# Patient Record
Sex: Female | Born: 1955 | Race: Black or African American | Hispanic: No | Marital: Single
Health system: Southern US, Community
[De-identification: ages and names within clinical notes are randomized; demographics above are authoritative.]

## PROBLEM LIST (undated history)

## (undated) DIAGNOSIS — I10 Essential (primary) hypertension: Secondary | ICD-10-CM

---

## 1997-10-16 ENCOUNTER — Encounter: Admission: RE | Admit: 1997-10-16 | Discharge: 1997-10-16 | Payer: Self-pay | Admitting: Hematology and Oncology

## 1998-06-17 ENCOUNTER — Encounter: Admission: RE | Admit: 1998-06-17 | Discharge: 1998-06-17 | Payer: Self-pay | Admitting: Internal Medicine

## 1998-06-17 ENCOUNTER — Other Ambulatory Visit: Admission: RE | Admit: 1998-06-17 | Discharge: 1998-06-17 | Payer: Self-pay | Admitting: *Deleted

## 1998-09-24 ENCOUNTER — Encounter: Admission: RE | Admit: 1998-09-24 | Discharge: 1998-09-24 | Payer: Self-pay | Admitting: Internal Medicine

## 1998-09-30 ENCOUNTER — Encounter: Admission: RE | Admit: 1998-09-30 | Discharge: 1998-09-30 | Payer: Self-pay | Admitting: Internal Medicine

## 2005-03-16 ENCOUNTER — Emergency Department (HOSPITAL_COMMUNITY): Admission: EM | Admit: 2005-03-16 | Discharge: 2005-03-16 | Payer: Self-pay | Admitting: Emergency Medicine

## 2005-10-25 ENCOUNTER — Emergency Department (HOSPITAL_COMMUNITY): Admission: EM | Admit: 2005-10-25 | Discharge: 2005-10-25 | Payer: Self-pay | Admitting: Emergency Medicine

## 2006-05-15 ENCOUNTER — Emergency Department (HOSPITAL_COMMUNITY): Admission: EM | Admit: 2006-05-15 | Discharge: 2006-05-15 | Payer: Self-pay | Admitting: Emergency Medicine

## 2006-06-23 ENCOUNTER — Encounter: Admission: RE | Admit: 2006-06-23 | Discharge: 2006-06-23 | Payer: Self-pay | Admitting: Internal Medicine

## 2006-07-08 ENCOUNTER — Encounter: Admission: RE | Admit: 2006-07-08 | Discharge: 2006-07-08 | Payer: Self-pay | Admitting: Internal Medicine

## 2012-07-21 ENCOUNTER — Other Ambulatory Visit: Payer: Self-pay | Admitting: Family

## 2012-07-21 ENCOUNTER — Ambulatory Visit
Admission: RE | Admit: 2012-07-21 | Discharge: 2012-07-21 | Disposition: A | Discharge status: Home / Self Care | Payer: BC Managed Care – PPO | Source: Ambulatory Visit | Attending: Internal Medicine | Admitting: Internal Medicine

## 2012-07-21 ENCOUNTER — Other Ambulatory Visit: Payer: Self-pay | Admitting: Internal Medicine

## 2012-07-21 DIAGNOSIS — R609 Edema, unspecified: Secondary | ICD-10-CM

## 2012-07-21 DIAGNOSIS — R52 Pain, unspecified: Secondary | ICD-10-CM

## 2013-08-23 ENCOUNTER — Other Ambulatory Visit: Payer: Self-pay

## 2013-08-23 DIAGNOSIS — Z1231 Encounter for screening mammogram for malignant neoplasm of breast: Secondary | ICD-10-CM

## 2013-09-04 ENCOUNTER — Ambulatory Visit
Admission: RE | Admit: 2013-09-04 | Discharge: 2013-09-04 | Disposition: A | Discharge status: Home / Self Care | Payer: BC Managed Care – PPO | Source: Ambulatory Visit

## 2013-09-04 ENCOUNTER — Encounter (INDEPENDENT_AMBULATORY_CARE_PROVIDER_SITE_OTHER): Payer: Self-pay

## 2013-09-04 DIAGNOSIS — Z1231 Encounter for screening mammogram for malignant neoplasm of breast: Secondary | ICD-10-CM

## 2013-09-18 ENCOUNTER — Other Ambulatory Visit: Payer: Self-pay | Admitting: Internal Medicine

## 2013-09-18 DIAGNOSIS — R3129 Other microscopic hematuria: Secondary | ICD-10-CM

## 2013-09-19 ENCOUNTER — Other Ambulatory Visit: Payer: BC Managed Care – PPO

## 2013-09-20 ENCOUNTER — Ambulatory Visit
Admission: RE | Admit: 2013-09-20 | Discharge: 2013-09-20 | Disposition: A | Discharge status: Home / Self Care | Payer: BC Managed Care – PPO | Source: Ambulatory Visit | Attending: Internal Medicine | Admitting: Internal Medicine

## 2013-09-20 DIAGNOSIS — R3129 Other microscopic hematuria: Secondary | ICD-10-CM

## 2014-09-13 ENCOUNTER — Other Ambulatory Visit: Payer: Self-pay | Admitting: Family

## 2014-09-13 DIAGNOSIS — M79661 Pain in right lower leg: Secondary | ICD-10-CM

## 2014-09-14 ENCOUNTER — Ambulatory Visit
Admission: RE | Admit: 2014-09-14 | Discharge: 2014-09-14 | Disposition: A | Discharge status: Home / Self Care | Payer: BC Managed Care – PPO | Source: Ambulatory Visit | Attending: Family | Admitting: Family

## 2014-09-14 DIAGNOSIS — M79661 Pain in right lower leg: Secondary | ICD-10-CM

## 2014-09-19 ENCOUNTER — Ambulatory Visit
Admission: RE | Admit: 2014-09-19 | Discharge: 2014-09-19 | Disposition: A | Discharge status: Home / Self Care | Payer: BC Managed Care – PPO | Source: Ambulatory Visit | Attending: Family | Admitting: Family

## 2014-09-19 DIAGNOSIS — M79661 Pain in right lower leg: Secondary | ICD-10-CM

## 2016-03-29 ENCOUNTER — Emergency Department (HOSPITAL_COMMUNITY): Payer: BC Managed Care – PPO

## 2016-03-29 ENCOUNTER — Other Ambulatory Visit: Payer: Self-pay

## 2016-03-29 ENCOUNTER — Encounter (HOSPITAL_COMMUNITY): Payer: Self-pay

## 2016-03-29 ENCOUNTER — Emergency Department (HOSPITAL_COMMUNITY)
Admission: EM | Admit: 2016-03-29 | Discharge: 2016-03-29 | Disposition: A | Discharge status: Home / Self Care | Payer: BC Managed Care – PPO | Attending: Emergency Medicine | Admitting: Emergency Medicine

## 2016-03-29 DIAGNOSIS — I10 Essential (primary) hypertension: Secondary | ICD-10-CM | POA: Insufficient documentation

## 2016-03-29 DIAGNOSIS — R0789 Other chest pain: Secondary | ICD-10-CM | POA: Insufficient documentation

## 2016-03-29 DIAGNOSIS — R079 Chest pain, unspecified: Secondary | ICD-10-CM

## 2016-03-29 HISTORY — DX: Essential (primary) hypertension: I10

## 2016-03-29 LAB — CBC WITH DIFFERENTIAL/PLATELET
Basophils Absolute: 0 10*3/uL (ref 0.0–0.1)
Basophils Relative: 0 %
Eosinophils Absolute: 0.1 10*3/uL (ref 0.0–0.7)
Eosinophils Relative: 1 %
HCT: 37.8 % (ref 36.0–46.0)
Hemoglobin: 12.4 g/dL (ref 12.0–15.0)
Lymphocytes Relative: 28 %
Lymphs Abs: 2.5 10*3/uL (ref 0.7–4.0)
MCH: 29 pg (ref 26.0–34.0)
MCHC: 32.8 g/dL (ref 30.0–36.0)
MCV: 88.5 fL (ref 78.0–100.0)
Monocytes Absolute: 0.6 10*3/uL (ref 0.1–1.0)
Monocytes Relative: 6 %
Neutro Abs: 5.8 10*3/uL (ref 1.7–7.7)
Neutrophils Relative %: 65 %
Platelets: 326 10*3/uL (ref 150–400)
RBC: 4.27 MIL/uL (ref 3.87–5.11)
RDW: 14.7 % (ref 11.5–15.5)
WBC: 8.9 10*3/uL (ref 4.0–10.5)

## 2016-03-29 LAB — BASIC METABOLIC PANEL
Anion gap: 11 (ref 5–15)
BUN: 14 mg/dL (ref 6–20)
CO2: 22 mmol/L (ref 22–32)
Calcium: 9.2 mg/dL (ref 8.9–10.3)
Chloride: 107 mmol/L (ref 101–111)
Creatinine, Ser: 1.01 mg/dL — ABNORMAL HIGH (ref 0.44–1.00)
GFR calc Af Amer: >60 mL/min (ref >60)
GFR calc non Af Amer: 58 mL/min — ABNORMAL LOW (ref >60)
GLUCOSE: 83 mg/dL (ref 65–99)
POTASSIUM: 3.8 mmol/L (ref 3.5–5.1)
Sodium: 140 mmol/L (ref 135–145)

## 2016-03-29 LAB — I-STAT TROPONIN, ED
Troponin i, poc: 0 ng/mL (ref 0.00–0.08)
Troponin i, poc: 0 ng/mL (ref 0.00–0.08)

## 2016-03-29 NOTE — ED Notes (Signed)
Patient d/c'd from monitor, continuous pulse oximetry and blood pressure cuff; patient getting dressed to be discharged home 

## 2016-03-29 NOTE — ED Triage Notes (Signed)
Pt brought in by EMS due having chest pain while at church this morning. Pt rating chest pain 6 out of 10. Pt is hypertensive. Chest pain is not radiating. Pt a&ox4.

## 2016-03-29 NOTE — ED Provider Notes (Signed)
[facility] Provider Note   CSN: [id number] Arrival date & time: 03/29/16  1143     History   Chief Complaint Chief Complaint  Patient presents with  . Chest Pain    HPI Phyllis Shaw is a 60 y.o. female.  HPI   Patient is a 60 year old female with history of hypertension who presents the ED with complaint of chest pain, onset 10 AM. Patient reports while she was seen at church she began to have pressure to the left side of her chest under her breast. She states the pain would last for a few minutes at a time and then resolve spontaneously. Patient reports her last episode of chest pain occurred upon arrival to the ED and resolved within a minute. Patient currently denies having any pain or complaints at this time. Denies fever, chills, diaphoresis, lightheadedness, dizziness, headache, cough, shortness of breath, wheezing, abdominal pain, nausea, vomiting, numbness, tingling, weakness, leg swelling. Patient denies taking any medication for her symptoms but reports EMS administered aspirin en route. Patient denies having similar episodes of chest pain the past. Denies personal or family history of heart disease. Denies smoking cigarettes. Denies any recent hospitalizations, recent surgery, recent immobilizations/long car or airplane travel, hx of CA, hx of DVT/PE.   PCP- Dr. Darcella GasmanStarkes ([facility])  Past Medical History:  Diagnosis Date  . Hypertension     There are no active problems to display for this patient.   History reviewed. No pertinent surgical history.  OB History    No data available       Home Medications    Prior to Admission medications   Not on File    Family History No family history on file.  Social History Social History  Substance Use Topics  . Smoking status: Unknown If Ever Smoked  . Smokeless tobacco: Not on file  . Alcohol use Not on file     Allergies   Patient has no allergy information on  record.   Review of Systems Review of Systems  Cardiovascular: Positive for chest pain.  All other systems reviewed and are negative.    Physical Exam Updated Vital Signs BP 176/80 (BP Location: Right Arm)   Pulse 70   Temp 97.8 F (36.6 C) (Oral)   Resp 20   SpO2 98%   Physical Exam  Constitutional: She is oriented to person, place, and time. She appears well-developed and well-nourished. No distress.  HENT:  Head: Normocephalic and atraumatic.  Mouth/Throat: Uvula is midline, oropharynx is clear and moist and mucous membranes are normal. No oropharyngeal exudate, posterior oropharyngeal edema, posterior oropharyngeal erythema or tonsillar abscesses. No tonsillar exudate.  Eyes: Conjunctivae and EOM are normal. Pupils are equal, round, and reactive to light. Right eye exhibits no discharge. Left eye exhibits no discharge. No scleral icterus.  Neck: Normal range of motion. Neck supple.  Cardiovascular: Normal rate, regular rhythm, normal heart sounds and intact distal pulses.   Pulmonary/Chest: Effort normal and breath sounds normal. No respiratory distress. She has no wheezes. She has no rales. She exhibits no tenderness.  Abdominal: Soft. Bowel sounds are normal. She exhibits no distension and no mass. There is no tenderness. There is no rebound and no guarding. No hernia.  Musculoskeletal: Normal range of motion. She exhibits no edema.  Neurological: She is alert and oriented to person, place, and time.  Skin: Skin is warm and dry. She is not diaphoretic.  Nursing note and vitals reviewed.    ED  Treatments / Results  Labs (all labs ordered are listed, but only abnormal results are displayed) Labs Reviewed  CBC WITH DIFFERENTIAL/PLATELET  BASIC METABOLIC PANEL  I-STAT TROPOININ, ED    EKG  EKG Interpretation  Date/Time:  Sunday March 29 2016 11:40:21 EST Ventricular Rate:  75 PR Interval:  208 QRS Duration: 96 QT Interval:  410 QTC Calculation: 457 R  Axis:   12 Text Interpretation:  Normal sinus rhythm Normal ECG Confirmed by RAY MD, Duwayne HeckANIELLE ([id number]) on 03/29/2016 11:57:28 AM       Radiology No results found.  Procedures Procedures (including critical care time)  Medications Ordered in ED Medications - No data to display   Initial Impression / Assessment and Plan / ED Course  I have reviewed the triage vital signs and the nursing notes.  Pertinent labs & imaging results that were available during my care of the patient were reviewed by me and considered in my medical decision making (see chart for details).  Clinical Course    Patient presents with intermittent episodes of chest pain that occurred while she was standing at church. Hx of HTN. Denies personal or family hx of cardiac disease. VSS. On initial evaluation pt denies any sxs or CP. Exam unremarkable. EKG showed NSR, no acute ischemic changes. Negative trop. CXR negative. Labs unremarkable. HEART score 2. On reevaluation patient is resting comfortably in bed in denies any chest pain or shortness of breath. Will order troponin for complete chest pain evaluation.  Hand-off to Dr. Lake BellsAnn Smith. Pending delta trop. Plan to d/c pt home with outpatient cardiology follow up assuming delta trop negative.  Final Clinical Impressions(s) / ED Diagnoses   Final diagnoses:  None    New Prescriptions New Prescriptions   No medications on file     Phyllis Henleicole Elizabeth Cinde Ebert, PA-C 03/29/16 1556    Margarita Grizzleanielle Ray, MD 04/06/16 1907

## 2016-03-29 NOTE — ED Notes (Signed)
Nicole PA at bedside   

## 2016-03-29 NOTE — ED Notes (Signed)
Patient transported to X-ray 

## 2016-03-29 NOTE — ED Provider Notes (Signed)
Assumed care from Melburn HakeNicole Nadeau, PA-C at 4 PM. Briefly patient is a [age] year female possible history of hypertension who presented with chest pain since 10 AM that started at church. Resolved spontaneously and she is currently chest pain-free. Initial workup including CXR, EKG, labs and troponin unremarkable. Awaiting delta troponin.  Delta troponin is negative 2. She was discharged in stable condition. Strict return precautions were discussed the patient and family and they report understanding agreement with plan. Patient was referred to cardiology for further outpatient workup.  During this shift, care supervised by Dr. Jodi MourningZavitz, ED attending   Isa RankinAnn B Tayquan Gassman, MD 03/29/16 1652    Blane OharaJoshua Zavitz, MD 03/30/16 [phone]

## 2016-06-02 ENCOUNTER — Other Ambulatory Visit: Payer: Self-pay | Admitting: Internal Medicine

## 2016-06-05 ENCOUNTER — Other Ambulatory Visit: Payer: Self-pay | Admitting: Family

## 2016-06-05 DIAGNOSIS — N644 Mastodynia: Secondary | ICD-10-CM

## 2016-06-10 ENCOUNTER — Ambulatory Visit
Admission: RE | Admit: 2016-06-10 | Discharge: 2016-06-10 | Disposition: A | Discharge status: Home / Self Care | Payer: BC Managed Care – PPO | Source: Ambulatory Visit | Attending: Family | Admitting: Family

## 2016-06-10 DIAGNOSIS — N644 Mastodynia: Secondary | ICD-10-CM

## 2019-02-14 ENCOUNTER — Other Ambulatory Visit: Payer: Self-pay

## 2019-02-14 DIAGNOSIS — Z20822 Contact with and (suspected) exposure to covid-19: Secondary | ICD-10-CM

## 2019-02-16 LAB — NOVEL CORONAVIRUS, NAA: SARS-CoV-2, NAA: NOT DETECTED

## 2019-12-06 ENCOUNTER — Other Ambulatory Visit: Payer: Self-pay | Admitting: Gastroenterology

## 2019-12-06 DIAGNOSIS — Z86010 Personal history of colonic polyps: Secondary | ICD-10-CM

## 2019-12-27 ENCOUNTER — Ambulatory Visit
Admission: RE | Admit: 2019-12-27 | Discharge: 2019-12-27 | Disposition: A | Discharge status: Home / Self Care | Payer: Medicare PPO | Source: Ambulatory Visit | Attending: Gastroenterology | Admitting: Gastroenterology

## 2019-12-27 DIAGNOSIS — Z86010 Personal history of colonic polyps: Secondary | ICD-10-CM

## 2020-02-26 ENCOUNTER — Ambulatory Visit (INDEPENDENT_AMBULATORY_CARE_PROVIDER_SITE_OTHER): Payer: Medicare PPO

## 2020-02-26 ENCOUNTER — Ambulatory Visit (HOSPITAL_COMMUNITY)
Admission: EM | Admit: 2020-02-26 | Discharge: 2020-02-26 | Disposition: A | Discharge status: Home / Self Care | Payer: Medicare PPO | Attending: Family Medicine | Admitting: Family Medicine

## 2020-02-26 ENCOUNTER — Other Ambulatory Visit: Payer: Self-pay

## 2020-02-26 ENCOUNTER — Encounter (HOSPITAL_COMMUNITY): Payer: Self-pay | Admitting: *Deleted

## 2020-02-26 DIAGNOSIS — M79601 Pain in right arm: Secondary | ICD-10-CM | POA: Diagnosis not present

## 2020-02-26 DIAGNOSIS — M79621 Pain in right upper arm: Secondary | ICD-10-CM | POA: Diagnosis not present

## 2020-02-26 DIAGNOSIS — W19XXXA Unspecified fall, initial encounter: Secondary | ICD-10-CM

## 2020-02-26 IMAGING — DX DG HUMERUS 2V *R*
2 series · 2 of 2 positions shown · non-contrast
Comparison: None.

CLINICAL DATA: Recent fall with arm pain, initial encounter

EXAM:
RIGHT HUMERUS - 2+ VIEW

[humerus ap]
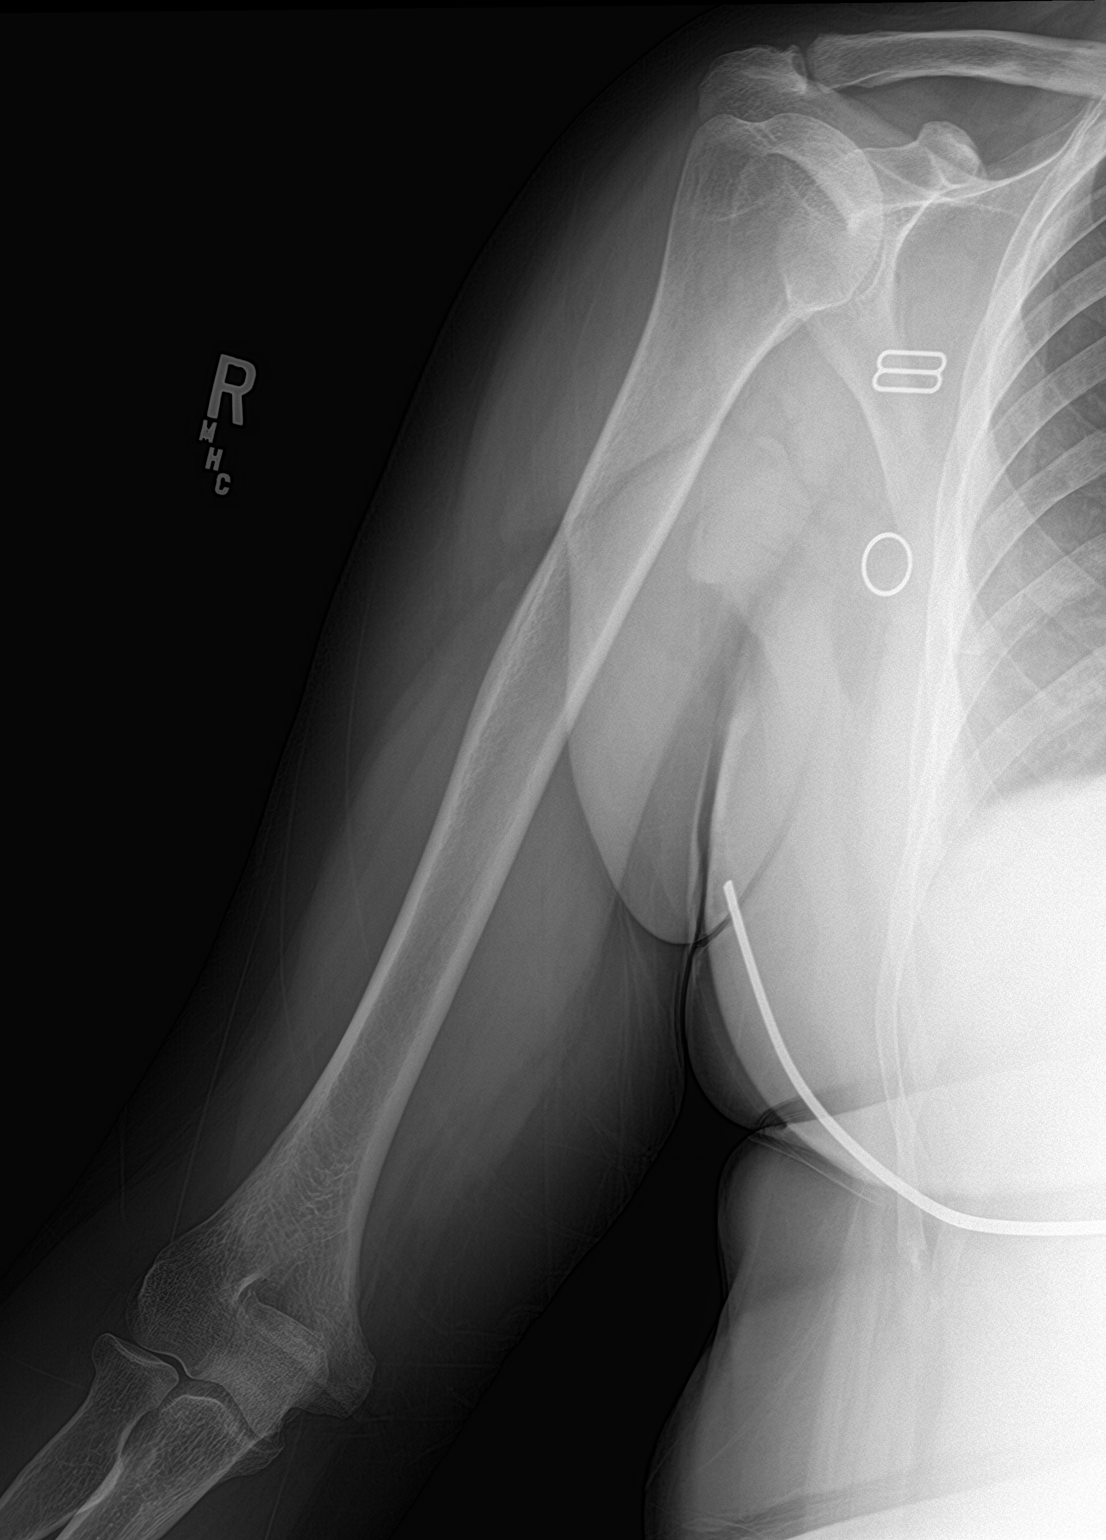

[humerus lat]
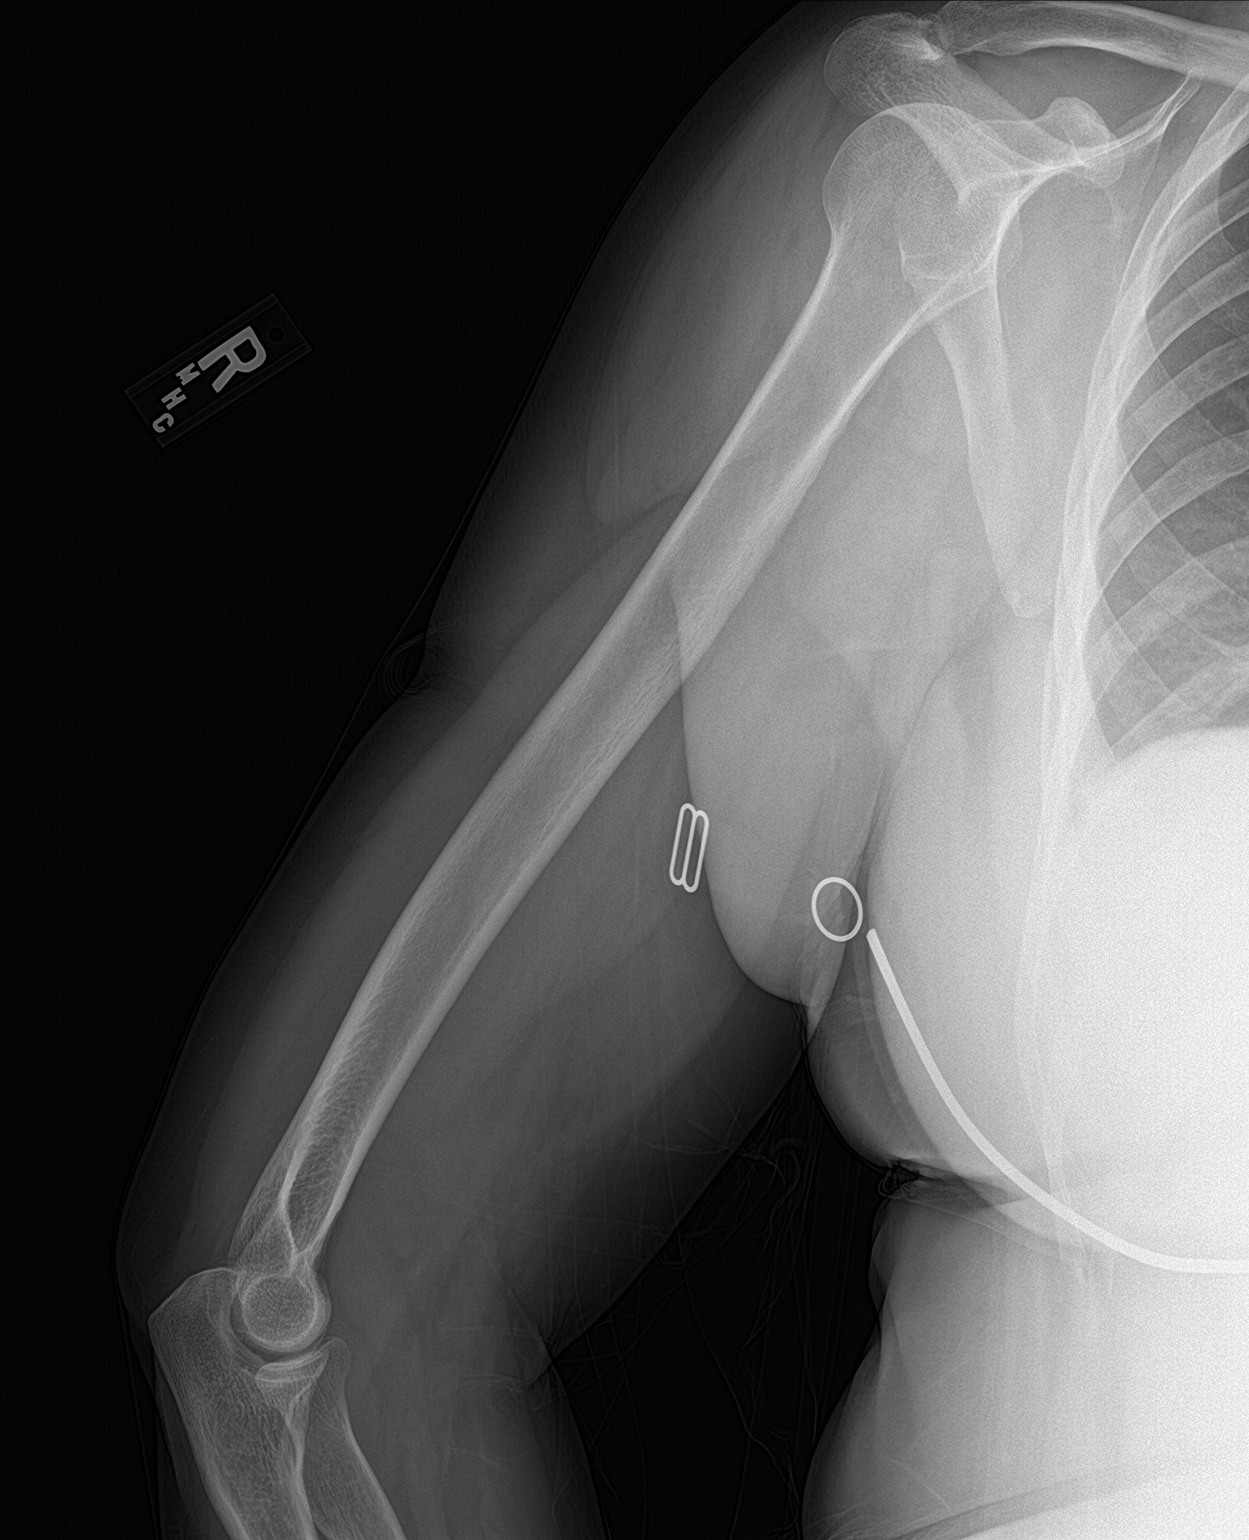

[2 of 2 positions shown; findings below may reference images not displayed]

FINDINGS: Degenerative changes of the acromioclavicular joint are seen. No
acute fracture or dislocation is noted. No soft tissue abnormality
is seen.
IMPRESSION: No acute abnormality noted.

## 2020-02-26 MED ORDER — IBUPROFEN 600 MG PO TABS: 600.0000 mg | ORAL_TABLET | Freq: Four times a day (QID) | ORAL | 0 refills | Status: AC | PRN

## 2020-02-26 NOTE — Discharge Instructions (Signed)
Wear sling for a couple of days to help rest arm Take ibuprofen as needed for Pain put ice on area to reduce pain and swelling  see your doctor if not improving in a few days

## 2020-02-26 NOTE — ED Provider Notes (Signed)
[facility]    CSN: [id number] Arrival date & time: 02/26/20  1712      History   Chief Complaint Chief Complaint  Patient presents with  . Fall  . Arm Injury    Rt    HPI Phyllis Shaw is a 64 y.o. female.   HPI  Patient states she fell today in her home.  She had her right arm over the side of the couch.  She points to just above her elbow and upper arm where the pain is most severe.  She states she can move her elbow and her shoulder.  She states that she has been trying to rest the arm and take over-the-counter medicines.  Still painful. She has well-controlled hypertension She tripped and fell because of balance, certain that she did not have any syncope or dizziness  Past Medical History:  Diagnosis Date  . Hypertension     There are no problems to display for this patient.   History reviewed. No pertinent surgical history.  OB History   No obstetric history on file.      Home Medications    Prior to Admission medications   Medication Sig Start Date End Date Taking? Authorizing Provider  telmisartan-hydrochlorothiazide (MICARDIS HCT) 80-12.5 MG tablet Take 1 tablet by mouth daily. 12/30/15  Yes [provider]  ibuprofen (ADVIL) 600 MG tablet Take 1 tablet (600 mg total) by mouth every 6 (six) hours as needed. 02/26/20   Eustace Moore, MD    Family History History reviewed. No pertinent family history.  Social History Social History   Tobacco Use  . Smoking status: Unknown If Ever Smoked  . Smokeless tobacco: Never Used  Substance Use Topics  . Alcohol use: Not Currently  . Drug use: Not on file     Allergies   Patient has no known allergies.   Review of Systems Review of Systems See HPI  Physical Exam Triage Vital Signs ED Triage Vitals  Enc Vitals Group     BP 02/26/20 1927 137/83     Pulse Rate 02/26/20 1927 79     Resp 02/26/20 1927 18     Temp 02/26/20 1927 98.3 F (36.8 C)     Temp Source 02/26/20  1927 Oral     SpO2 02/26/20 1927 100 %     Weight 02/26/20 1924 260 lb (117.9 kg)     Height 02/26/20 1924 5\' 9"  (1.753 m)     Head Circumference --      Peak Flow --      Pain Score 02/26/20 1923 4     Pain Loc --      Pain Edu? --      Excl. in GC? --    No data found.  Updated Vital Signs BP 137/83 (BP Location: Left Arm)   Pulse 79   Temp 98.3 F (36.8 C) (Oral)   Resp 18   Ht 5\' 9"  (1.753 m)   Wt 117.9 kg   SpO2 100%   BMI 38.40 kg/m      Physical Exam Constitutional:      General: She is not in acute distress.    Appearance: She is well-developed.     Comments: No acute distress  HENT:     Head: Normocephalic and atraumatic.     Mouth/Throat:     Comments: Mask is in place Eyes:     Conjunctiva/sclera: Conjunctivae normal.     Pupils: Pupils are equal, round,  and reactive to light.  Cardiovascular:     Rate and Rhythm: Normal rate.  Pulmonary:     Effort: Pulmonary effort is normal. No respiratory distress.  Abdominal:     Palpations: Abdomen is soft.  Musculoskeletal:        General: Normal range of motion.     Cervical back: Normal range of motion.     Comments: Right upper extremity is tender to palpation over the distal humerus above the elbow.  Good range of motion of the shoulder and elbow.  No swelling.  Moderate tenderness palpation.  No ecchymosis seen.  Skin:    General: Skin is warm and dry.  Neurological:     Mental Status: She is alert.     Gait: Gait normal.  Psychiatric:        Mood and Affect: Mood normal.        Behavior: Behavior normal.      UC Treatments / Results  Labs (all labs ordered are listed, but only abnormal results are displayed) Labs Reviewed - No data to display  EKG   Radiology DG Humerus Right  Result Date: 02/26/2020 CLINICAL DATA:  Recent fall with arm pain, initial encounter EXAM: RIGHT HUMERUS - 2+ VIEW COMPARISON:  None. FINDINGS: Degenerative changes of the acromioclavicular joint are seen. No acute  fracture or dislocation is noted. No soft tissue abnormality is seen. IMPRESSION: No acute abnormality noted. Electronically Signed   By: Alcide Clever M.D.   On: 02/26/2020 19:57    Procedures Procedures (including critical care time)  Medications Ordered in UC Medications - No data to display  Initial Impression / Assessment and Plan / UC Course  I have reviewed the triage vital signs and the nursing notes.  Pertinent labs & imaging results that were available during my care of the patient were reviewed by me and considered in my medical decision making (see chart for details).     X-rays are reviewed.  No fracture identified.  Conservative management discussed. Final Clinical Impressions(s) / UC Diagnoses   Final diagnoses:  Arm pain, right  Fall at home, initial encounter     Discharge Instructions     Wear sling for a couple of days to help rest arm Take ibuprofen as needed for Pain put ice on area to reduce pain and swelling  see your doctor if not improving in a few days     ED Prescriptions    Medication Sig Dispense Auth. Provider   ibuprofen (ADVIL) 600 MG tablet Take 1 tablet (600 mg total) by mouth every 6 (six) hours as needed. 30 tablet Eustace Moore, MD     PDMP not reviewed this encounter.   Eustace Moore, MD 02/26/20 [phone]

## 2020-02-26 NOTE — ED Triage Notes (Signed)
PT fell today and her arm hit the side of her couch. Pt reports pain to upper RT arm. Pt can raise arm above head unassisted. Pt moves all fingers on Rt hand.  RtRadial pulse strong.

## 2020-11-07 ENCOUNTER — Other Ambulatory Visit: Payer: Self-pay | Admitting: Nurse Practitioner

## 2020-11-07 DIAGNOSIS — Z13820 Encounter for screening for osteoporosis: Secondary | ICD-10-CM

## 2021-05-13 ENCOUNTER — Other Ambulatory Visit: Payer: Medicare PPO

## 2021-09-30 ENCOUNTER — Ambulatory Visit
Admission: RE | Admit: 2021-09-30 | Discharge: 2021-09-30 | Disposition: A | Discharge status: Home / Self Care | Payer: Medicare PPO | Source: Ambulatory Visit | Attending: Nurse Practitioner | Admitting: Nurse Practitioner

## 2021-09-30 DIAGNOSIS — Z13820 Encounter for screening for osteoporosis: Secondary | ICD-10-CM

## 2023-02-10 ENCOUNTER — Encounter (HOSPITAL_COMMUNITY): Payer: Self-pay

## 2023-02-10 ENCOUNTER — Ambulatory Visit (HOSPITAL_COMMUNITY)
Admission: EM | Admit: 2023-02-10 | Discharge: 2023-02-10 | Disposition: A | Discharge status: Home / Self Care | Payer: Medicare PPO | Attending: Internal Medicine | Admitting: Internal Medicine

## 2023-02-10 DIAGNOSIS — Z1152 Encounter for screening for COVID-19: Secondary | ICD-10-CM

## 2023-02-10 NOTE — ED Provider Notes (Signed)
[facility]    CSN: [id number] Arrival date & time: 02/10/23  1811      History   Chief Complaint Chief Complaint  Patient presents with   Covid Test    HPI Phyllis Shaw is a 67 y.o. female.   Phyllis Shaw is a 67 y.o. female presenting for chief complaint of possible exposure to COVID-19.  Patient was recently on a cruise to [place] and would like to be tested for COVID-19.  She is not currently experiencing any symptoms and denies known sick contacts. She has not taken an at home COVID test. No history of chronic respiratory problems, never smoker. History of HTN, takes medication as prescribed.      Past Medical History:  Diagnosis Date   Hypertension     There are no problems to display for this patient.   History reviewed. No pertinent surgical history.  OB History   No obstetric history on file.      Home Medications    Prior to Admission medications   Medication Sig Start Date End Date Taking? Authorizing Provider  ibuprofen (ADVIL) 600 MG tablet Take 1 tablet (600 mg total) by mouth every 6 (six) hours as needed. 02/26/20   Eustace Moore, MD  telmisartan-hydrochlorothiazide (MICARDIS HCT) 80-12.5 MG tablet Take 1 tablet by mouth daily. 12/30/15   [provider]    Family History History reviewed. No pertinent family history.  Social History Social History   Tobacco Use   Smoking status: Unknown   Smokeless tobacco: Never  Substance Use Topics   Alcohol use: Not Currently     Allergies   Patient has no known allergies.   Review of Systems Review of Systems Per HPI  Physical Exam Triage Vital Signs ED Triage Vitals [02/10/23 1842]  Encounter Vitals Group     BP (!) 152/78     Systolic BP Percentile      Diastolic BP Percentile      Pulse Rate 70     Resp 16     Temp 98.7 F (37.1 C)     Temp Source Oral     SpO2 95 %     Weight 242 lb (109.8 kg)     Height 5\' 9"  (1.753 m)     Head Circumference       Peak Flow      Pain Score 0     Pain Loc      Pain Education      Exclude from Growth Chart    No data found.  Updated Vital Signs BP (!) 152/78 (BP Location: Left Arm)   Pulse 70   Temp 98.7 F (37.1 C) (Oral)   Resp 16   Ht 5\' 9"  (1.753 m)   Wt 242 lb (109.8 kg)   SpO2 95%   BMI 35.74 kg/m   Visual Acuity Right Eye Distance:   Left Eye Distance:   Bilateral Distance:    Right Eye Near:   Left Eye Near:    Bilateral Near:     Physical Exam Vitals and nursing note reviewed.  Constitutional:      Appearance: She is not ill-appearing or toxic-appearing.  HENT:     Head: Normocephalic and atraumatic.     Right Ear: Hearing and external ear normal.     Left Ear: Hearing and external ear normal.     Nose: Nose normal.     Mouth/Throat:     Lips: Pink.  Eyes:  General: Lids are normal. Vision grossly intact. Gaze aligned appropriately.     Extraocular Movements: Extraocular movements intact.     Conjunctiva/sclera: Conjunctivae normal.  Cardiovascular:     Rate and Rhythm: Normal rate and regular rhythm.     Heart sounds: Normal heart sounds, S1 normal and S2 normal.  Pulmonary:     Effort: Pulmonary effort is normal. No respiratory distress.     Breath sounds: Normal breath sounds and air entry.  Musculoskeletal:     Cervical back: Neck supple.  Skin:    General: Skin is warm and dry.     Capillary Refill: Capillary refill takes less than 2 seconds.     Findings: No rash.  Neurological:     General: No focal deficit present.     Mental Status: She is alert and oriented to person, place, and time. Mental status is at baseline.     Cranial Nerves: No dysarthria or facial asymmetry.  Psychiatric:        Mood and Affect: Mood normal.        Speech: Speech normal.        Behavior: Behavior normal.        Thought Content: Thought content normal.        Judgment: Judgment normal.      UC Treatments / Results  Labs (all labs ordered are listed, but only  abnormal results are displayed) Labs Reviewed  SARS CORONAVIRUS 2 (TAT 6-24 HRS)    EKG   Radiology No results found.  Procedures Procedures (including critical care time)  Medications Ordered in UC Medications - No data to display  Initial Impression / Assessment and Plan / UC Course  I have reviewed the triage vital signs and the nursing notes.  Pertinent labs & imaging results that were available during my care of the patient were reviewed by me and considered in my medical decision making (see chart for details).   1. Encounter for screening for COVID-19 COVID-19 testing is pending, patient is a candidate for antiviral therapy due to age.  CDC guidelines discussed.  Counseled patient on potential for adverse effects with medications prescribed/recommended today, strict ER and return-to-clinic precautions discussed, patient verbalized understanding.    Final Clinical Impressions(s) / UC Diagnoses   Final diagnoses:  Encounter for screening for COVID-19     Discharge Instructions      COVID testing is pending, staff will call you if this is positive. Wear a mask for 5 days of symptoms while you are in public, then you may remove your mask. You may go back to work if you do not have a fever for 24 hours without any medicines.  If you develop any new or worsening symptoms or if your symptoms do not start to improve, please return here or follow-up with your primary care provider. If your symptoms are severe, please go to the emergency room.    ED Prescriptions   None    PDMP not reviewed this encounter.   Carlisle Beers, [place] 02/10/23 1907

## 2023-02-10 NOTE — Discharge Instructions (Signed)
COVID testing is pending, staff will call you if this is positive. Wear a mask for 5 days of symptoms while you are in public, then you may remove your mask. You may go back to work if you do not have a fever for 24 hours without any medicines.  If you develop any new or worsening symptoms or if your symptoms do not start to improve, please return here or follow-up with your primary care provider. If your symptoms are severe, please go to the emergency room.

## 2023-02-10 NOTE — ED Triage Notes (Signed)
Patient here today to have a Covid test after getting off of a cruise last Monday. She is not having any symptoms.

## 2023-02-11 LAB — SARS CORONAVIRUS 2 (TAT 6-24 HRS): SARS Coronavirus 2: NEGATIVE

## 2023-04-13 ENCOUNTER — Other Ambulatory Visit: Payer: Self-pay | Admitting: Nurse Practitioner

## 2023-04-13 DIAGNOSIS — Z1231 Encounter for screening mammogram for malignant neoplasm of breast: Secondary | ICD-10-CM

## 2023-05-11 ENCOUNTER — Ambulatory Visit
Admission: RE | Admit: 2023-05-11 | Discharge: 2023-05-11 | Disposition: A | Discharge status: Home / Self Care | Payer: Medicare PPO | Source: Ambulatory Visit | Attending: Nurse Practitioner | Admitting: Nurse Practitioner

## 2023-05-11 DIAGNOSIS — Z1231 Encounter for screening mammogram for malignant neoplasm of breast: Secondary | ICD-10-CM

## 2023-10-13 ENCOUNTER — Ambulatory Visit (INDEPENDENT_AMBULATORY_CARE_PROVIDER_SITE_OTHER)

## 2023-10-13 ENCOUNTER — Encounter (HOSPITAL_COMMUNITY): Payer: Self-pay

## 2023-10-13 ENCOUNTER — Ambulatory Visit (HOSPITAL_COMMUNITY)
Admission: EM | Admit: 2023-10-13 | Discharge: 2023-10-13 | Disposition: A | Discharge status: Home / Self Care | Attending: Internal Medicine | Admitting: Internal Medicine

## 2023-10-13 DIAGNOSIS — M25512 Pain in left shoulder: Secondary | ICD-10-CM | POA: Diagnosis not present

## 2023-10-13 DIAGNOSIS — M62838 Other muscle spasm: Secondary | ICD-10-CM | POA: Diagnosis not present

## 2023-10-13 MED ORDER — METHOCARBAMOL 500 MG PO TABS: 500.0000 mg | ORAL_TABLET | Freq: Two times a day (BID) | ORAL | 0 refills | Status: AC

## 2023-10-13 NOTE — Discharge Instructions (Addendum)
 Left posterior shoulder pain and spasms.  X-ray done today is negative for an acute findings.  The symptoms are most consistent with a trapezius muscle spasm given the location across the top and posterior part of the shoulder.  We will treat this with the following: Robaxin 500 mg every 12 hours as needed for muscle spasms.  Use caution as this medication can cause drowsiness. Apply moist heat to the area several times daily.  Use caution to not cause thermal burns to your skin Light stretching to help improve mobility and reduce stiffness If symptoms fail to improve then may need to consider follow-up with orthopedics, your PCP or back at urgent care.

## 2023-10-13 NOTE — ED Provider Notes (Signed)
 [facility]    CSN: [id number] Arrival date & time: 10/13/23  1842      History   Chief Complaint Chief Complaint  Patient presents with   Shoulder Pain    HPI Phyllis Shaw is a 68 y.o. female.   68 year old female who presents urgent care with complaints of left shoulder decreased range of motion and spasms along the left posterior shoulder upper back.  She reports that this started yesterday.  She has not had any injury to the area.  She has taken ibuprofen  but it did not help much.  She has noticed some swelling in her arm but is also not moving her arm much due to the pain in her shoulder and upper back.  She denies any radiating pain down the back or to the chest.  Her grip strength is normal.  She is right-handed.  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   Shoulder Pain Associated symptoms: no back pain and no fever     Past Medical History:  Diagnosis Date   Hypertension     There are no active problems to display for this patient.   History reviewed. No pertinent surgical history.  OB History   No obstetric history on file.      Home Medications    Prior to Admission medications   Medication Sig Start Date End Date Taking? Authorizing Provider  methocarbamol (ROBAXIN) 500 MG tablet Take 1 tablet (500 mg total) by mouth 2 (two) times daily. 10/13/23  Yes Claiborne Stroble A, PA-C  ibuprofen  (ADVIL ) 600 MG tablet Take 1 tablet (600 mg total) by mouth every 6 (six) hours as needed. 02/26/20   Stephany Ehrich, MD  telmisartan-hydrochlorothiazide (MICARDIS HCT) 80-12.5 MG tablet Take 1 tablet by mouth daily. 12/30/15   [provider]    Family History History reviewed. No pertinent family history.  Social History Social History   Tobacco Use   Smoking status: Never   Smokeless tobacco: Never  Substance Use Topics   Alcohol use: Not Currently     Allergies   Patient has no known allergies.   Review of  Systems Review of Systems  Constitutional:  Negative for chills and fever.  HENT:  Negative for ear pain and sore throat.   Eyes:  Negative for pain and visual disturbance.  Respiratory:  Negative for cough and shortness of breath.   Cardiovascular:  Negative for chest pain and palpitations.  Gastrointestinal:  Negative for abdominal pain and vomiting.  Genitourinary:  Negative for dysuria and hematuria.  Musculoskeletal:  Negative for arthralgias and back pain.       Left shoulder pain  Skin:  Negative for color change and rash.  Neurological:  Negative for seizures and syncope.  All other systems reviewed and are negative.    Physical Exam Triage Vital Signs ED Triage Vitals [10/13/23 1905]  Encounter Vitals Group     BP (!) 164/85     Systolic BP Percentile      Diastolic BP Percentile      Pulse Rate 79     Resp 18     Temp 97.9 F (36.6 C)     Temp Source Oral     SpO2 96 %     Weight      Height      Head Circumference      Peak Flow      Pain Score 10     Pain Loc  Pain Education      Exclude from Growth Chart    No data found.  Updated Vital Signs BP (!) 164/85 (BP Location: Right Arm)   Pulse 79   Temp 97.9 F (36.6 C) (Oral)   Resp 18   SpO2 96%   Visual Acuity Right Eye Distance:   Left Eye Distance:   Bilateral Distance:    Right Eye Near:   Left Eye Near:    Bilateral Near:     Physical Exam Vitals and nursing note reviewed.  Constitutional:      General: She is not in acute distress.    Appearance: She is well-developed.  HENT:     Head: Normocephalic and atraumatic.  Eyes:     Conjunctiva/sclera: Conjunctivae normal.  Cardiovascular:     Rate and Rhythm: Normal rate and regular rhythm.     Heart sounds: No murmur heard. Pulmonary:     Effort: Pulmonary effort is normal. No respiratory distress.     Breath sounds: Normal breath sounds.  Abdominal:     Palpations: Abdomen is soft.     Tenderness: There is no abdominal  tenderness.  Musculoskeletal:        General: No swelling.     Left shoulder: Swelling and tenderness present. No deformity. Decreased range of motion. Normal strength. Normal pulse.     Cervical back: Neck supple.     Comments: Trace swelling in the left arm, patient is reluctant to move the arm much due to pain along the top of the shoulder and posterior shoulder  Skin:    General: Skin is warm and dry.     Capillary Refill: Capillary refill takes less than 2 seconds.  Neurological:     Mental Status: She is alert.  Psychiatric:        Mood and Affect: Mood normal.      UC Treatments / Results  Labs (all labs ordered are listed, but only abnormal results are displayed) Labs Reviewed - No data to display  EKG   Radiology DG Shoulder Left Result Date: 10/13/2023 CLINICAL DATA:  Left shoulder pain, decreased range of movement EXAM: LEFT SHOULDER - 2+ VIEW COMPARISON:  None Available. FINDINGS: There is no evidence of fracture or dislocation. There is no evidence of arthropathy or other focal bone abnormality. Soft tissues are unremarkable. IMPRESSION: Negative. Electronically Signed   By: Worthy Heads M.D.   On: 10/13/2023 20:18    Procedures Procedures (including critical care time)  Medications Ordered in UC Medications - No data to display  Initial Impression / Assessment and Plan / UC Course  I have reviewed the triage vital signs and the nursing notes.  Pertinent labs & imaging results that were available during my care of the patient were reviewed by me and considered in my medical decision making (see chart for details).     Acute pain of left shoulder - Plan: DG Shoulder Left, DG Shoulder Left  Trapezius muscle spasm   Left posterior shoulder pain and spasms.  X-ray done today is negative for an acute findings.  The symptoms are most consistent with a trapezius muscle spasm given the location across the top and posterior part of the shoulder. We offered  prednisone but patient was reluctant and given the short duration of the symptoms, the benefit may not be significant.  We will treat this with the following: Robaxin 500 mg every 12 hours as needed for muscle spasms.  Use caution as this medication can cause  drowsiness. Apply moist heat to the area several times daily.  Use caution to not cause thermal burns to your skin Light stretching to help improve mobility and reduce stiffness If symptoms fail to improve then may need to consider follow-up with orthopedics, your PCP or back at urgent care.  Final Clinical Impressions(s) / UC Diagnoses   Final diagnoses:  Acute pain of left shoulder  Trapezius muscle spasm     Discharge Instructions      Left posterior shoulder pain and spasms.  X-ray done today is negative for an acute findings.  The symptoms are most consistent with a trapezius muscle spasm given the location across the top and posterior part of the shoulder.  We will treat this with the following: Robaxin 500 mg every 12 hours as needed for muscle spasms.  Use caution as this medication can cause drowsiness. Apply moist heat to the area several times daily.  Use caution to not cause thermal burns to your skin Light stretching to help improve mobility and reduce stiffness If symptoms fail to improve then may need to consider follow-up with orthopedics, your PCP or back at urgent care.  ED Prescriptions     Medication Sig Dispense Auth. Provider   methocarbamol (ROBAXIN) 500 MG tablet Take 1 tablet (500 mg total) by mouth 2 (two) times daily. 20 tablet Kreg Pesa, [place]      PDMP not reviewed this encounter.   Kreg Pesa, [place] 10/13/23 2042

## 2023-10-13 NOTE — ED Triage Notes (Signed)
 Pt c/o lt shoulder pain with lt arm swelling since yesterday. Denies injury. States took ibuprofen  with no relief.
